# Patient Record
Sex: Female | Born: 1995 | Race: White | Hispanic: No | Marital: Single
Health system: Southern US, Community
[De-identification: ages and names within clinical notes are randomized; demographics above are authoritative.]

---
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
## 2019-10-04 ENCOUNTER — Ambulatory Visit (INDEPENDENT_AMBULATORY_CARE_PROVIDER_SITE_OTHER): Payer: BC Managed Care – PPO | Admitting: Dermatology

## 2019-10-04 ENCOUNTER — Other Ambulatory Visit: Payer: Self-pay

## 2019-10-04 VITALS — BP 105/73 | HR 92 | Wt 150.4 lb

## 2019-10-04 DIAGNOSIS — L700 Acne vulgaris: Secondary | ICD-10-CM | POA: Diagnosis not present

## 2019-10-04 MED ORDER — SPIRONOLACTONE 50 MG PO TABS: 50.0000 mg | ORAL_TABLET | Freq: Every day | ORAL | 2 refills | Status: DC

## 2019-10-04 NOTE — Patient Instructions (Signed)
Spironolactone can cause increased urination and cause blood pressure to decrease. Please watch for signs of lightheadedness and be cautious when changing position. It can sometimes cause breast tenderness or an irregular period in premenopausal women. It can also increase potassium. The increase in potassium usually is not a concern unless you are taking other medicines that also increase potassium, so please be sure your doctor knows all of the other medications you are taking. This medication should not be taken  by pregnant women.  Topical retinoid medications like tretinoin/Retin-A, adapalene/Differin, tazarotene/Fabior, and Epiduo/Epiduo Forte can cause dryness and irritation when first started. Only apply a pea-sized amount to the entire affected area. Avoid applying it around the eyes, edges of mouth and creases at the nose. If you experience irritation, use a good moisturizer first and/or apply the medicine less often. If you are doing well with the medicine, you can increase how often you use it until you are applying every night. Be careful with sun protection while using this medication as it can make you sensitive to the sun. This medicine should not be used by pregnant women.   

## 2019-10-04 NOTE — Progress Notes (Signed)
   Follow-Up Visit   Subjective  Joy Ortega is a 24 y.o. female who presents for the following: Acne (face - patient thinks her acne may have flared more since starting treatment. She is currently using Tazorac 0.1% cream QHS and Dapsone 5% gel QAM. She does sometimes forget to use the Dapsone in the morning).  The following portions of the chart were reviewed this encounter and updated as appropriate:  Allergies  Meds  Problems  Med Hx  Surg Hx  Fam Hx     Review of Systems:  No other skin or systemic complaints except as noted in HPI or Assessment and Plan.  Objective  Well appearing patient in no apparent distress; mood and affect are within normal limits.  A focused examination was performed including face, neck, chest and back and the face . Relevant physical exam findings are noted in the Assessment and Plan.  Objective  Head - Anterior (Face): Several pink papules along the face and neck  Assessment & Plan    Acne vulgaris Head - Anterior (Face)  Continue Dapsone 5% gel QAM, and  Tazorac 0.1% cream QHS.  Start Spironolactone 50mg  po QAM #30 2RF.   Topical retinoid medications like tretinoin/Retin-A, adapalene/Differin, tazarotene/Fabior, and Epiduo/Epiduo Forte can cause dryness and irritation when first started. Only apply a pea-sized amount to the entire affected area. Avoid applying it around the eyes, edges of mouth and creases at the nose. If you experience irritation, use a good moisturizer first and/or apply the medicine less often. If you are doing well with the medicine, you can increase how often you use it until you are applying every night. Be careful with sun protection while using this medication as it can make you sensitive to the sun. This medicine should not be used by pregnant women.   Spironolactone can cause increased urination and cause blood pressure to decrease. Please watch for signs of lightheadedness and be cautious when changing position.  It can sometimes cause breast tenderness or an irregular period in premenopausal women. It can also increase potassium. The increase in potassium usually is not a concern unless you are taking other medicines that also increase potassium, so please be sure your doctor knows all of the other medications you are taking. This medication should not be taken  by pregnant women.   spironolactone (ALDACTONE) 50 MG tablet - Head - Anterior (Face)  spironolactone (ALDACTONE) 50 MG tablet - Head - Anterior (Face)  Return in about 2 months (around 12/04/2019).  12/06/2019, CMA, am acting as scribe for Maylene Roes, MD .  Documentation: I have reviewed the above documentation for accuracy and completeness, and I agree with the above.  Armida Sans, MD

## 2019-10-05 ENCOUNTER — Encounter: Payer: Self-pay | Admitting: Dermatology

## 2019-12-02 ENCOUNTER — Other Ambulatory Visit: Payer: Self-pay

## 2019-12-02 ENCOUNTER — Ambulatory Visit (INDEPENDENT_AMBULATORY_CARE_PROVIDER_SITE_OTHER): Payer: BC Managed Care – PPO | Admitting: Dermatology

## 2019-12-02 VITALS — BP 98/67 | HR 85

## 2019-12-02 DIAGNOSIS — L700 Acne vulgaris: Secondary | ICD-10-CM

## 2019-12-02 MED ORDER — DOXYCYCLINE HYCLATE 50 MG PO CAPS: 50.0000 mg | ORAL_CAPSULE | Freq: Every day | ORAL | 3 refills | Status: DC

## 2019-12-02 MED ORDER — SPIRONOLACTONE 25 MG PO TABS: 25.0000 mg | ORAL_TABLET | Freq: Two times a day (BID) | ORAL | 3 refills | Status: DC

## 2019-12-02 NOTE — Progress Notes (Signed)
   Follow-Up Visit   Subjective  Joy Ortega is a 24 y.o. female who presents for the following: Acne (Acne follow up, pt states that she has seen some improvement, has some new flare ups in a few places on face. She stopped using the Tazorac cream after last appt here. Dizzy yesterday).  The following portions of the chart were reviewed this encounter and updated as appropriate: Allergies  Meds  Problems  Med Hx  Surg Hx  Fam Hx      Review of Systems: No other skin or systemic complaints.  Objective  Well appearing patient in no apparent distress; mood and affect are within normal limits.  A focused examination was performed including face. Relevant physical exam findings are noted in the Assessment and Plan.  Objective  Head - Anterior (Face): 4 active papules of mandible and 1 of forehead  Assessment & Plan  Acne vulgaris - on systemic meds - some improvement but persistent - and possibly having some side effects to meds Head - Anterior (Face) Decrease dose of Spironolactone due to one dizzy spell. May take 25 mg twice a day or two 25 mg once a day or one 25 mg once a day if dizziness persists.   Continue dapsone gel daily  Discussed Isotretinoin - may consider in the future, information given today.   spironolactone (ALDACTONE) 25 MG tablet - Head - Anterior (Face)  doxycycline (VIBRAMYCIN) 50 MG capsule - Head - Anterior (Face)  Return in about 2 months (around 02/01/2020) for Acne.   Tanna Furry, CMA, am acting as scribe for Dr. Armida Sans.  Documentation: I have reviewed the above documentation for accuracy and completeness, and I agree with the above.  Armida Sans, MD

## 2019-12-08 ENCOUNTER — Encounter: Payer: Self-pay | Admitting: Dermatology

## 2019-12-25 ENCOUNTER — Other Ambulatory Visit: Payer: Self-pay | Admitting: Dermatology

## 2019-12-25 DIAGNOSIS — L700 Acne vulgaris: Secondary | ICD-10-CM

## 2020-02-15 ENCOUNTER — Encounter: Payer: Self-pay | Admitting: Dermatology

## 2020-02-15 ENCOUNTER — Other Ambulatory Visit: Payer: Self-pay

## 2020-02-15 ENCOUNTER — Ambulatory Visit (INDEPENDENT_AMBULATORY_CARE_PROVIDER_SITE_OTHER): Payer: BC Managed Care – PPO | Admitting: Dermatology

## 2020-02-15 DIAGNOSIS — L719 Rosacea, unspecified: Secondary | ICD-10-CM

## 2020-02-15 DIAGNOSIS — L700 Acne vulgaris: Secondary | ICD-10-CM

## 2020-02-15 MED ORDER — SPIRONOLACTONE 25 MG PO TABS: 25.0000 mg | ORAL_TABLET | Freq: Two times a day (BID) | ORAL | 4 refills | Status: DC

## 2020-02-15 MED ORDER — DAPSONE 7.5 % EX GEL: 1.0000 "application " | Freq: Every day | CUTANEOUS | 4 refills | Status: AC

## 2020-02-15 MED ORDER — DOXYCYCLINE HYCLATE 50 MG PO CAPS: 50.0000 mg | ORAL_CAPSULE | Freq: Every day | ORAL | 4 refills | Status: AC

## 2020-02-15 NOTE — Progress Notes (Signed)
   Follow-Up Visit   Subjective  Joy Ortega is a 24 y.o. female who presents for the following: Acne (face, Doxycycline 50mg  1 po qd, Spironolactone 25mg  1 po bid, pt not using Aczone).  The following portions of the chart were reviewed this encounter and updated as appropriate:  Allergies  Meds  Problems  Med Hx  Surg Hx  Fam Hx     Review of Systems:  No other skin or systemic complaints except as noted in HPI or Assessment and Plan.  Objective  Well appearing patient in no apparent distress; mood and affect are within normal limits.  A focused examination was performed including face. Relevant physical exam findings are noted in the Assessment and Plan.  Objective  face: Paps face, pinkness face  Objective  face: Erythema face   Assessment & Plan  Acne vulgaris -chronic not to goal face Improved Cont Spironolactone 25mg  1 po bid Cont Doxycycline 50mg  1 po qd with food and drink Start topical dapsone twice daily  BP today 87/63  Discussed Isotretinoin -may consider in the future if not continuing to improve.  Spironolactone can cause increased urination and cause blood pressure to decrease. Please watch for signs of lightheadedness and be cautious when changing position. It can sometimes cause breast tenderness or an irregular period in premenopausal women. It can also increase potassium. The increase in potassium usually is not a concern unless you are taking other medicines that also increase potassium, so please be sure your doctor knows all of the other medications you are taking. This medication should not be taken by pregnant women.  This medicine should also not be taken together with sulfa drugs like Bactrim (trimethoprim/sulfamethexazole).   Doxycycline should be taken with food to prevent nausea. Do not lay down for 30 minutes after taking. Be cautious with sun exposure and use good sun protection while on this medication. Pregnant women should not take  this medication.    Reordered Medications spironolactone (ALDACTONE) 25 MG tablet doxycycline (VIBRAMYCIN) 50 MG capsule  Rosacea face Chronic, progressive -not to goal  Cont Doxycycline 50mg  1 po qd with food and drink start Aczone 7.5% gel qd  If Aczone not covered or too expensive will send in Sulfa Cleanse to [facility]  Dapsone (ACZONE) 7.5 % GEL - face  Return in about 4 months (around 06/14/2020) for acne/rosacea.   I, , RMA, am acting as scribe for , MD .  Documentation: I have reviewed the above documentation for accuracy and completeness, and I agree with the above.  04-10-1995, MD

## 2020-06-02 ENCOUNTER — Other Ambulatory Visit: Payer: Self-pay | Admitting: Dermatology

## 2020-06-02 DIAGNOSIS — L700 Acne vulgaris: Secondary | ICD-10-CM

## 2020-06-21 ENCOUNTER — Ambulatory Visit: Payer: BC Managed Care – PPO | Admitting: Dermatology

## 2020-06-27 ENCOUNTER — Other Ambulatory Visit: Payer: Self-pay | Admitting: Dermatology

## 2020-06-27 DIAGNOSIS — L700 Acne vulgaris: Secondary | ICD-10-CM

## 2020-06-29 ENCOUNTER — Ambulatory Visit: Payer: BC Managed Care – PPO | Admitting: Dermatology

## 2020-07-24 ENCOUNTER — Other Ambulatory Visit: Payer: Self-pay | Admitting: Dermatology

## 2020-07-24 DIAGNOSIS — L700 Acne vulgaris: Secondary | ICD-10-CM

## 2020-07-26 ENCOUNTER — Ambulatory Visit: Payer: BC Managed Care – PPO | Admitting: Dermatology

## 2020-08-22 ENCOUNTER — Other Ambulatory Visit: Payer: Self-pay | Admitting: Dermatology

## 2020-08-22 DIAGNOSIS — L700 Acne vulgaris: Secondary | ICD-10-CM

## 2020-08-26 ENCOUNTER — Other Ambulatory Visit: Payer: Self-pay | Admitting: Dermatology

## 2020-08-26 DIAGNOSIS — L700 Acne vulgaris: Secondary | ICD-10-CM
